# Patient Record
Sex: Male | Born: 1975 | Race: White | Hispanic: No | Marital: Single | Smoking: Current every day smoker
Health system: Southern US, Community
[De-identification: ages and names within clinical notes are randomized; demographics above are authoritative.]

## PROBLEM LIST (undated history)

## (undated) ENCOUNTER — Inpatient Hospital Stay: Admission: EM | Payer: Self-pay | Source: Home / Self Care

## (undated) DIAGNOSIS — R569 Unspecified convulsions: Secondary | ICD-10-CM

## (undated) DIAGNOSIS — R410 Disorientation, unspecified: Secondary | ICD-10-CM

## (undated) DIAGNOSIS — R531 Weakness: Secondary | ICD-10-CM

## (undated) DIAGNOSIS — J029 Acute pharyngitis, unspecified: Secondary | ICD-10-CM

## (undated) DIAGNOSIS — R634 Abnormal weight loss: Secondary | ICD-10-CM

## (undated) DIAGNOSIS — R1310 Dysphagia, unspecified: Secondary | ICD-10-CM

## (undated) DIAGNOSIS — Z87820 Personal history of traumatic brain injury: Secondary | ICD-10-CM

## (undated) DIAGNOSIS — R51 Headache: Secondary | ICD-10-CM

## (undated) DIAGNOSIS — R0981 Nasal congestion: Secondary | ICD-10-CM

## (undated) HISTORY — DX: Nasal congestion: R09.81

## (undated) HISTORY — DX: Dysphagia, unspecified: R13.10

## (undated) HISTORY — DX: Abnormal weight loss: R63.4

## (undated) HISTORY — DX: Personal history of traumatic brain injury: Z87.820

## (undated) HISTORY — DX: Disorientation, unspecified: R41.0

## (undated) HISTORY — DX: Headache: R51

## (undated) HISTORY — DX: Acute pharyngitis, unspecified: J02.9

## (undated) HISTORY — DX: Weakness: R53.1

## (undated) HISTORY — DX: Unspecified convulsions: R56.9

---

## 2002-05-19 ENCOUNTER — Inpatient Hospital Stay (HOSPITAL_COMMUNITY): Admission: AC | Admit: 2002-05-19 | Discharge: 2002-05-27 | Payer: Self-pay

## 2002-05-22 ENCOUNTER — Encounter: Payer: Self-pay | Admitting: General Surgery

## 2002-05-27 ENCOUNTER — Inpatient Hospital Stay (HOSPITAL_COMMUNITY): Admission: EM | Admit: 2002-05-27 | Discharge: 2002-05-30 | Payer: Self-pay | Admitting: Psychiatry

## 2007-09-17 ENCOUNTER — Inpatient Hospital Stay (HOSPITAL_COMMUNITY): Admission: AC | Admit: 2007-09-17 | Discharge: 2007-09-18 | Payer: Self-pay

## 2007-09-17 ENCOUNTER — Ambulatory Visit: Payer: Self-pay | Admitting: Psychiatry

## 2007-09-18 ENCOUNTER — Inpatient Hospital Stay (HOSPITAL_COMMUNITY): Admission: AD | Admit: 2007-09-18 | Discharge: 2007-09-21 | Payer: Self-pay | Admitting: *Deleted

## 2007-09-18 ENCOUNTER — Ambulatory Visit: Payer: Self-pay | Admitting: *Deleted

## 2008-03-24 ENCOUNTER — Emergency Department (HOSPITAL_COMMUNITY): Admission: EM | Admit: 2008-03-24 | Discharge: 2008-03-24 | Payer: Self-pay | Admitting: Emergency Medicine

## 2009-05-10 ENCOUNTER — Emergency Department (HOSPITAL_COMMUNITY): Admission: EM | Admit: 2009-05-10 | Discharge: 2009-05-10 | Payer: Self-pay | Admitting: Emergency Medicine

## 2010-01-07 ENCOUNTER — Emergency Department (HOSPITAL_COMMUNITY): Admission: EM | Admit: 2010-01-07 | Discharge: 2010-01-07 | Payer: Self-pay | Admitting: Emergency Medicine

## 2010-02-14 ENCOUNTER — Inpatient Hospital Stay (HOSPITAL_COMMUNITY): Admission: EM | Admit: 2010-02-14 | Discharge: 2010-02-14 | Payer: Self-pay | Admitting: Emergency Medicine

## 2010-06-13 ENCOUNTER — Encounter: Payer: Self-pay | Admitting: Emergency Medicine

## 2010-06-28 ENCOUNTER — Emergency Department (HOSPITAL_COMMUNITY)
Admission: EM | Admit: 2010-06-28 | Discharge: 2010-06-28 | Disposition: A | Discharge status: Home / Self Care | Payer: Self-pay | Attending: Emergency Medicine | Admitting: Emergency Medicine

## 2010-06-28 DIAGNOSIS — Z79899 Other long term (current) drug therapy: Secondary | ICD-10-CM | POA: Insufficient documentation

## 2010-06-28 DIAGNOSIS — R404 Transient alteration of awareness: Secondary | ICD-10-CM | POA: Insufficient documentation

## 2010-06-28 DIAGNOSIS — G40909 Epilepsy, unspecified, not intractable, without status epilepticus: Secondary | ICD-10-CM | POA: Insufficient documentation

## 2010-06-28 DIAGNOSIS — R51 Headache: Secondary | ICD-10-CM | POA: Insufficient documentation

## 2010-06-28 DIAGNOSIS — F29 Unspecified psychosis not due to a substance or known physiological condition: Secondary | ICD-10-CM | POA: Insufficient documentation

## 2010-06-28 LAB — POCT I-STAT, CHEM 8
BUN: 11 mg/dL (ref 6–23)
Calcium, Ion: 1.14 mmol/L (ref 1.12–1.32)
Creatinine, Ser: 1.2 mg/dL (ref 0.4–1.5)
Glucose, Bld: 97 mg/dL (ref 70–99)
TCO2: 31 mmol/L (ref 0–100)

## 2010-06-28 LAB — PHENYTOIN LEVEL, TOTAL: Phenytoin Lvl: <2.5 ug/mL — ABNORMAL LOW (ref 10.0–20.0)

## 2010-08-05 LAB — CBC
HCT: 39.7 % (ref 39.0–52.0)
Hemoglobin: 15.1 g/dL (ref 13.0–17.0)
MCH: 28.9 pg (ref 26.0–34.0)
MCH: 29.6 pg (ref 26.0–34.0)
MCHC: 33.2 g/dL (ref 30.0–36.0)
MCHC: 34.4 g/dL (ref 30.0–36.0)
MCV: 86.9 fL (ref 78.0–100.0)
Platelets: 234 10*3/uL (ref 150–400)
RDW: 14.5 % (ref 11.5–15.5)
RDW: 14.6 % (ref 11.5–15.5)

## 2010-08-05 LAB — BASIC METABOLIC PANEL
BUN: 9 mg/dL (ref 6–23)
BUN: 9 mg/dL (ref 6–23)
CO2: 29 mEq/L (ref 19–32)
CO2: 27 mEq/L (ref 19–32)
Calcium: 9.1 mg/dL (ref 8.4–10.5)
Chloride: 104 mEq/L (ref 96–112)
Creatinine, Ser: 0.94 mg/dL (ref 0.4–1.5)
GFR calc non Af Amer: >60 mL/min (ref >60)
Glucose, Bld: 127 mg/dL — ABNORMAL HIGH (ref 70–99)
Glucose, Bld: 90 mg/dL (ref 70–99)
Potassium: 4.4 mEq/L (ref 3.5–5.1)

## 2010-08-05 LAB — DIFFERENTIAL
Basophils Absolute: 0.1 10*3/uL (ref 0.0–0.1)
Basophils Relative: 1 % (ref 0–1)
Eosinophils Absolute: 0.4 10*3/uL (ref 0.0–0.7)
Monocytes Absolute: 0.6 10*3/uL (ref 0.1–1.0)
Monocytes Relative: 5 % (ref 3–12)
Neutro Abs: 6.6 10*3/uL (ref 1.7–7.7)
Neutrophils Relative %: 56 % (ref 43–77)

## 2010-08-12 ENCOUNTER — Emergency Department (HOSPITAL_COMMUNITY): Payer: Medicaid Other

## 2010-08-12 ENCOUNTER — Inpatient Hospital Stay (HOSPITAL_COMMUNITY)
Admission: EM | Admit: 2010-08-12 | Discharge: 2010-08-16 | DRG: 100 | Disposition: A | Discharge status: Home / Self Care | Payer: Medicaid Other | Source: Ambulatory Visit | Attending: Internal Medicine | Admitting: Internal Medicine

## 2010-08-12 ENCOUNTER — Inpatient Hospital Stay (HOSPITAL_COMMUNITY): Payer: Medicaid Other

## 2010-08-12 DIAGNOSIS — K922 Gastrointestinal hemorrhage, unspecified: Secondary | ICD-10-CM | POA: Diagnosis not present

## 2010-08-12 DIAGNOSIS — J9600 Acute respiratory failure, unspecified whether with hypoxia or hypercapnia: Secondary | ICD-10-CM

## 2010-08-12 DIAGNOSIS — R509 Fever, unspecified: Secondary | ICD-10-CM | POA: Diagnosis not present

## 2010-08-12 DIAGNOSIS — S069X9S Unspecified intracranial injury with loss of consciousness of unspecified duration, sequela: Secondary | ICD-10-CM

## 2010-08-12 DIAGNOSIS — E876 Hypokalemia: Secondary | ICD-10-CM | POA: Diagnosis not present

## 2010-08-12 DIAGNOSIS — Z9119 Patient's noncompliance with other medical treatment and regimen: Secondary | ICD-10-CM

## 2010-08-12 DIAGNOSIS — G40401 Other generalized epilepsy and epileptic syndromes, not intractable, with status epilepticus: Secondary | ICD-10-CM

## 2010-08-12 DIAGNOSIS — R404 Transient alteration of awareness: Secondary | ICD-10-CM | POA: Diagnosis not present

## 2010-08-12 DIAGNOSIS — Z91199 Patient's noncompliance with other medical treatment and regimen due to unspecified reason: Secondary | ICD-10-CM

## 2010-08-12 DIAGNOSIS — F17200 Nicotine dependence, unspecified, uncomplicated: Secondary | ICD-10-CM | POA: Diagnosis present

## 2010-08-12 DIAGNOSIS — G40802 Other epilepsy, not intractable, without status epilepticus: Principal | ICD-10-CM | POA: Diagnosis present

## 2010-08-12 DIAGNOSIS — S069XAS Unspecified intracranial injury with loss of consciousness status unknown, sequela: Secondary | ICD-10-CM

## 2010-08-12 LAB — URINALYSIS, ROUTINE W REFLEX MICROSCOPIC
Bilirubin Urine: NEGATIVE
Glucose, UA: NEGATIVE mg/dL
Nitrite: NEGATIVE
Specific Gravity, Urine: 1.019 (ref 1.005–1.030)
pH: 5 (ref 5.0–8.0)

## 2010-08-12 LAB — HEPATIC FUNCTION PANEL
ALT: 63 U/L — ABNORMAL HIGH (ref 0–53)
AST: 41 U/L — ABNORMAL HIGH (ref 0–37)
Alkaline Phosphatase: 114 U/L (ref 39–117)
Bilirubin, Direct: <0.1 mg/dL (ref 0.0–0.3)
Total Bilirubin: 0.2 mg/dL — ABNORMAL LOW (ref 0.3–1.2)

## 2010-08-12 LAB — URINE MICROSCOPIC-ADD ON

## 2010-08-12 LAB — POCT I-STAT, CHEM 8
Calcium, Ion: 1.11 mmol/L — ABNORMAL LOW (ref 1.12–1.32)
Chloride: 111 mEq/L (ref 96–112)
Glucose, Bld: 76 mg/dL (ref 70–99)
HCT: 45 % (ref 39.0–52.0)
Hemoglobin: 15.3 g/dL (ref 13.0–17.0)
Potassium: 4.9 mEq/L (ref 3.5–5.1)

## 2010-08-12 LAB — BLOOD GAS, ARTERIAL
Acid-base deficit: 9.4 mmol/L — ABNORMAL HIGH (ref 0.0–2.0)
MECHVT: 470 mL
PEEP: 5 cmH2O
RATE: 18 resp/min
pCO2 arterial: 33.5 mmHg — ABNORMAL LOW (ref 35.0–45.0)
pH, Arterial: 7.296 — ABNORMAL LOW (ref 7.350–7.450)
pO2, Arterial: 232 mmHg — ABNORMAL HIGH (ref 80.0–100.0)

## 2010-08-12 LAB — CBC
MCHC: 32.9 g/dL (ref 30.0–36.0)
Platelets: 317 10*3/uL (ref 150–400)
RDW: 15.2 % (ref 11.5–15.5)
WBC: 29.5 10*3/uL — ABNORMAL HIGH (ref 4.0–10.5)

## 2010-08-12 LAB — DIFFERENTIAL
Basophils Absolute: 0 10*3/uL (ref 0.0–0.1)
Eosinophils Absolute: 0 10*3/uL (ref 0.0–0.7)
Lymphs Abs: 1.2 10*3/uL (ref 0.7–4.0)
Monocytes Absolute: 3.2 10*3/uL — ABNORMAL HIGH (ref 0.1–1.0)
Neutrophils Relative %: 85 % — ABNORMAL HIGH (ref 43–77)

## 2010-08-12 LAB — RAPID URINE DRUG SCREEN, HOSP PERFORMED
Barbiturates: NOT DETECTED
Cocaine: NOT DETECTED
Opiates: NOT DETECTED
Tetrahydrocannabinol: NOT DETECTED

## 2010-08-12 LAB — PHENYTOIN LEVEL, TOTAL: Phenytoin Lvl: 23.1 ug/mL — ABNORMAL HIGH (ref 10.0–20.0)

## 2010-08-12 LAB — MRSA PCR SCREENING: MRSA by PCR: NEGATIVE

## 2010-08-13 ENCOUNTER — Inpatient Hospital Stay (HOSPITAL_COMMUNITY): Payer: Medicaid Other

## 2010-08-13 DIAGNOSIS — F05 Delirium due to known physiological condition: Secondary | ICD-10-CM

## 2010-08-13 LAB — PHOSPHORUS
Phosphorus: 4 mg/dL (ref 2.3–4.6)
Phosphorus: 3 mg/dL (ref 2.3–4.6)

## 2010-08-13 LAB — CBC
HCT: 38.8 % — ABNORMAL LOW (ref 39.0–52.0)
HCT: 38.4 % — ABNORMAL LOW (ref 39.0–52.0)
Hemoglobin: 12.8 g/dL — ABNORMAL LOW (ref 13.0–17.0)
MCH: 28.4 pg (ref 26.0–34.0)
MCHC: 32.5 g/dL (ref 30.0–36.0)
MCHC: 33.3 g/dL (ref 30.0–36.0)
Platelets: 256 10*3/uL (ref 150–400)
RDW: 15.3 % (ref 11.5–15.5)

## 2010-08-13 LAB — BLOOD GAS, ARTERIAL
Acid-base deficit: 3.2 mmol/L — ABNORMAL HIGH (ref 0.0–2.0)
Bicarbonate: 18.4 mEq/L — ABNORMAL LOW (ref 20.0–24.0)
Bicarbonate: 21.6 mEq/L (ref 20.0–24.0)
FIO2: 0.4 %
O2 Saturation: 99.6 %
O2 Saturation: 99.4 %
PEEP: 5 cmH2O
TCO2: 19.2 mmol/L (ref 0–100)
pO2, Arterial: 184 mmHg — ABNORMAL HIGH (ref 80.0–100.0)
pO2, Arterial: 193 mmHg — ABNORMAL HIGH (ref 80.0–100.0)

## 2010-08-13 LAB — BASIC METABOLIC PANEL
GFR calc non Af Amer: >60 mL/min (ref >60)
GFR calc non Af Amer: >60 mL/min (ref >60)
Glucose, Bld: 101 mg/dL — ABNORMAL HIGH (ref 70–99)
Glucose, Bld: 106 mg/dL — ABNORMAL HIGH (ref 70–99)
Potassium: 4.2 mEq/L (ref 3.5–5.1)
Potassium: 4.1 mEq/L (ref 3.5–5.1)
Sodium: 136 mEq/L (ref 135–145)
Sodium: 135 mEq/L (ref 135–145)

## 2010-08-13 LAB — PHENYTOIN LEVEL, TOTAL: Phenytoin Lvl: 21.2 ug/mL — ABNORMAL HIGH (ref 10.0–20.0)

## 2010-08-13 LAB — DIFFERENTIAL
Basophils Absolute: 0 10*3/uL (ref 0.0–0.1)
Lymphocytes Relative: 20 % (ref 12–46)
Monocytes Absolute: 3.1 10*3/uL — ABNORMAL HIGH (ref 0.1–1.0)
Monocytes Relative: 15 % — ABNORMAL HIGH (ref 3–12)
Neutro Abs: 13.5 10*3/uL — ABNORMAL HIGH (ref 1.7–7.7)

## 2010-08-13 LAB — URINE CULTURE: Colony Count: NO GROWTH

## 2010-08-13 LAB — PROTIME-INR: INR: 1.09 (ref 0.00–1.49)

## 2010-08-13 LAB — PROCALCITONIN: Procalcitonin: 11.71 ng/mL
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
## 2010-08-15 ENCOUNTER — Inpatient Hospital Stay (HOSPITAL_COMMUNITY): Payer: Medicaid Other

## 2010-08-15 LAB — CBC
MCHC: 33.2 g/dL (ref 30.0–36.0)
Platelets: 253 10*3/uL (ref 150–400)
RDW: 15.1 % (ref 11.5–15.5)
WBC: 9.6 10*3/uL (ref 4.0–10.5)

## 2010-08-15 LAB — CULTURE, RESPIRATORY W GRAM STAIN

## 2010-08-15 LAB — BASIC METABOLIC PANEL
CO2: 25 mEq/L (ref 19–32)
Calcium: 8.9 mg/dL (ref 8.4–10.5)
GFR calc Af Amer: >60 mL/min (ref >60)
GFR calc non Af Amer: >60 mL/min (ref >60)
Potassium: 2.9 mEq/L — ABNORMAL LOW (ref 3.5–5.1)
Sodium: 142 mEq/L (ref 135–145)

## 2010-08-16 LAB — BASIC METABOLIC PANEL
Calcium: 8.8 mg/dL (ref 8.4–10.5)
GFR calc Af Amer: >60 mL/min (ref >60)
GFR calc non Af Amer: >60 mL/min (ref >60)
Potassium: 3.1 mEq/L — ABNORMAL LOW (ref 3.5–5.1)
Sodium: 139 mEq/L (ref 135–145)
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
## 2010-08-19 LAB — CULTURE, BLOOD (ROUTINE X 2)
Culture  Setup Time: 201203230117
Culture  Setup Time: 201203230117
Culture: NO GROWTH

## 2010-08-23 LAB — BASIC METABOLIC PANEL
GFR calc non Af Amer: >60 mL/min (ref >60)
Potassium: 4.6 mEq/L (ref 3.5–5.1)
Sodium: 132 mEq/L — ABNORMAL LOW (ref 135–145)

## 2010-08-23 LAB — BASIC METABOLIC PANEL WITH GFR
BUN: 7 mg/dL (ref 6–23)
CO2: 29 meq/L (ref 19–32)
Calcium: 9.2 mg/dL (ref 8.4–10.5)
Chloride: 97 meq/L (ref 96–112)
Creatinine, Ser: 0.88 mg/dL (ref 0.4–1.5)
GFR calc Af Amer: >60 mL/min (ref >60)
Glucose, Bld: 98 mg/dL (ref 70–99)

## 2010-08-23 LAB — RAPID URINE DRUG SCREEN, HOSP PERFORMED
Amphetamines: NOT DETECTED
Barbiturates: NOT DETECTED
Benzodiazepines: NOT DETECTED
Cocaine: NOT DETECTED
Opiates: POSITIVE — AB
Tetrahydrocannabinol: NOT DETECTED

## 2010-08-23 LAB — DIFFERENTIAL
Basophils Absolute: 0.1 K/uL (ref 0.0–0.1)
Basophils Relative: 1 % (ref 0–1)
Eosinophils Absolute: 0.5 K/uL (ref 0.0–0.7)
Eosinophils Relative: 4 % (ref 0–5)
Lymphocytes Relative: 23 % (ref 12–46)
Lymphs Abs: 2.8 10*3/uL (ref 0.7–4.0)
Monocytes Absolute: 1.4 10*3/uL — ABNORMAL HIGH (ref 0.1–1.0)
Monocytes Relative: 11 % (ref 3–12)
Neutro Abs: 7.4 K/uL (ref 1.7–7.7)
Neutrophils Relative %: 61 % (ref 43–77)

## 2010-08-23 LAB — URINALYSIS, ROUTINE W REFLEX MICROSCOPIC
Bilirubin Urine: NEGATIVE
Glucose, UA: NEGATIVE mg/dL
Hgb urine dipstick: NEGATIVE
Ketones, ur: NEGATIVE mg/dL
Nitrite: NEGATIVE
Protein, ur: NEGATIVE mg/dL
Specific Gravity, Urine: 1.016 (ref 1.005–1.030)
Urobilinogen, UA: 0.2 mg/dL (ref 0.0–1.0)
pH: 5.5 (ref 5.0–8.0)

## 2010-08-23 LAB — CBC
HCT: 43.8 % (ref 39.0–52.0)
Hemoglobin: 14.7 g/dL (ref 13.0–17.0)
MCHC: 33.6 g/dL (ref 30.0–36.0)
MCV: 87.4 fL (ref 78.0–100.0)
Platelets: 288 K/uL (ref 150–400)
RBC: 5.01 MIL/uL (ref 4.22–5.81)
RDW: 14.1 % (ref 11.5–15.5)
WBC: 12.1 10*3/uL — ABNORMAL HIGH (ref 4.0–10.5)
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
## 2011-02-13 ENCOUNTER — Emergency Department (HOSPITAL_COMMUNITY)
Admission: EM | Admit: 2011-02-13 | Discharge: 2011-02-14 | Disposition: A | Discharge status: Home / Self Care | Payer: Medicaid Other | Attending: Emergency Medicine | Admitting: Emergency Medicine

## 2011-02-13 DIAGNOSIS — G40909 Epilepsy, unspecified, not intractable, without status epilepticus: Secondary | ICD-10-CM | POA: Insufficient documentation

## 2011-02-13 DIAGNOSIS — Z79899 Other long term (current) drug therapy: Secondary | ICD-10-CM | POA: Insufficient documentation

## 2011-02-13 DIAGNOSIS — F29 Unspecified psychosis not due to a substance or known physiological condition: Secondary | ICD-10-CM | POA: Insufficient documentation

## 2011-02-14 LAB — CBC
MCH: 28.7 pg (ref 26.0–34.0)
MCV: 83.2 fL (ref 78.0–100.0)
Platelets: 261 10*3/uL (ref 150–400)
RDW: 14 % (ref 11.5–15.5)
WBC: 12 10*3/uL — ABNORMAL HIGH (ref 4.0–10.5)

## 2011-02-14 LAB — URINALYSIS, ROUTINE W REFLEX MICROSCOPIC
Ketones, ur: NEGATIVE mg/dL
Leukocytes, UA: NEGATIVE
Nitrite: NEGATIVE
Protein, ur: NEGATIVE mg/dL

## 2011-02-14 LAB — DIFFERENTIAL
Basophils Relative: 0 % (ref 0–1)
Eosinophils Absolute: 0.2 10*3/uL (ref 0.0–0.7)
Monocytes Absolute: 1 10*3/uL (ref 0.1–1.0)
Neutro Abs: 3.7 10*3/uL (ref 1.7–7.7)
Neutrophils Relative %: 31 % — ABNORMAL LOW (ref 43–77)

## 2011-02-14 LAB — PHENYTOIN LEVEL, TOTAL: Phenytoin Lvl: 24.3 ug/mL — ABNORMAL HIGH (ref 10.0–20.0)

## 2011-02-14 LAB — PATHOLOGIST SMEAR REVIEW

## 2011-02-14 LAB — POCT I-STAT, CHEM 8
Calcium, Ion: 1.1 mmol/L — ABNORMAL LOW (ref 1.12–1.32)
HCT: 49 % (ref 39.0–52.0)
Hemoglobin: 16.7 g/dL (ref 13.0–17.0)
TCO2: 26 mmol/L (ref 0–100)

## 2011-02-15 LAB — DIFFERENTIAL
Basophils Absolute: 0.2 — ABNORMAL HIGH
Basophils Relative: 2 — ABNORMAL HIGH
Eosinophils Absolute: 0.3
Monocytes Absolute: 1.1 — ABNORMAL HIGH
Neutro Abs: 4.8
Neutrophils Relative %: 42 — ABNORMAL LOW

## 2011-02-15 LAB — CBC
MCHC: 34.5
Platelets: 232
RBC: 4.91
WBC: 11.4 — ABNORMAL HIGH

## 2011-02-15 LAB — POCT I-STAT, CHEM 8
Calcium, Ion: 1.14
Chloride: 109
HCT: 44
Hemoglobin: 15

## 2011-02-15 LAB — TRICYCLICS SCREEN, URINE: TCA Scrn: NOT DETECTED

## 2011-02-15 LAB — CREATININE, SERUM
Creatinine, Ser: 1.04
GFR calc Af Amer: >60

## 2011-02-24 ENCOUNTER — Encounter (INDEPENDENT_AMBULATORY_CARE_PROVIDER_SITE_OTHER): Payer: Self-pay | Admitting: General Surgery

## 2011-02-25 ENCOUNTER — Encounter (INDEPENDENT_AMBULATORY_CARE_PROVIDER_SITE_OTHER): Payer: Self-pay | Admitting: General Surgery

## 2011-02-25 ENCOUNTER — Ambulatory Visit (INDEPENDENT_AMBULATORY_CARE_PROVIDER_SITE_OTHER): Payer: Medicaid Other | Admitting: General Surgery

## 2011-02-25 DIAGNOSIS — L723 Sebaceous cyst: Secondary | ICD-10-CM
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
## 2011-03-14 ENCOUNTER — Other Ambulatory Visit: Payer: Self-pay | Admitting: Otolaryngology

## 2011-03-14 ENCOUNTER — Ambulatory Visit (HOSPITAL_BASED_OUTPATIENT_CLINIC_OR_DEPARTMENT_OTHER)
Admission: RE | Admit: 2011-03-14 | Discharge: 2011-03-14 | Disposition: A | Discharge status: Home / Self Care | Payer: Medicaid Other | Source: Ambulatory Visit | Attending: Otolaryngology | Admitting: Otolaryngology

## 2011-03-14 DIAGNOSIS — L723 Sebaceous cyst: Secondary | ICD-10-CM | POA: Insufficient documentation

## 2011-03-14 DIAGNOSIS — F17200 Nicotine dependence, unspecified, uncomplicated: Secondary | ICD-10-CM | POA: Insufficient documentation

## 2011-03-14 DIAGNOSIS — J4489 Other specified chronic obstructive pulmonary disease: Secondary | ICD-10-CM | POA: Insufficient documentation

## 2011-03-14 DIAGNOSIS — Z01812 Encounter for preprocedural laboratory examination: Secondary | ICD-10-CM | POA: Insufficient documentation

## 2011-03-14 DIAGNOSIS — G40909 Epilepsy, unspecified, not intractable, without status epilepticus: Secondary | ICD-10-CM | POA: Insufficient documentation

## 2011-03-14 DIAGNOSIS — J449 Chronic obstructive pulmonary disease, unspecified: Secondary | ICD-10-CM | POA: Insufficient documentation

## 2011-03-14 LAB — POCT HEMOGLOBIN-HEMACUE: Hemoglobin: 11 g/dL — ABNORMAL LOW (ref 13.0–17.0)

## 2011-03-17 NOTE — Op Note (Signed)
  NAMERYKIN, ROUTE           ACCOUNT NO.:  [id number]  MEDICAL RECORD NO.:  [id number]  LOCATION:                                 FACILITY:  PHYSICIAN:  Lynell Kussman H. Pollyann Kennedy, MD     DATE OF BIRTH:  [birth year]  DATE OF PROCEDURE:  03/14/2011 DATE OF DISCHARGE:                              OPERATIVE REPORT   PREOPERATIVE DIAGNOSIS:  Right neck cyst.  POSTOPERATIVE DIAGNOSIS:  Right neck epidermoid cyst.  PROCEDURE:  Excision of right neck epidermoid cyst.  SURGEON:  Aryana Wonnacott H. Pollyann Kennedy, MD  ANESTHESIA:  Local anesthesia was used with intravenous sedation and monitored anesthesia care.  COMPLICATIONS:  No complications.  ESTIMATED BLOOD LOSS:  Minimal.  FINDINGS:  A 4-5 cm cystic subcutaneous mass in the right anterior neck containing a sebaceous-like material with some black matter also within.  HISTORY:  A 35 year old with a slowly enlarging right neck mass.  Risks, benefits, alternatives, and complications of the procedure wereexplained to the patient, seemed to understand, and agreed to surgery.  PROCEDURE:  The patient was taken to the operating room, placed in the operating table in supine position.  Following induction of intravenous sedation, the right neck was prepped and draped in a standard fashion. An ellipse of skin overlying the center of the lesion was outlined with a marking pen and local anesthesia was infiltrated around the entire lesion.  About 8 mL was used of 1% Xylocaine with epinephrine.  The ellipse of skin was incised with a 15-scalpel and care was taken to dissect down into the subcutaneous plane.  With gentle retraction of the skin edges, I was able to dissect around the lesion.  Right before the lesion was completely dissected out, it did rupture externally not within the wound.  The entire cyst was removed using careful dissection. Facial nerve branches were not identified as the level of dissection was felt to be superficial to that.   Electrocautery was used for completion of hemostasis.  The wound was irrigated with saline.  Closure was accomplished using 4-0 chromic suture in the subcutaneous and deeper plane and Dermabond on the skin.  The patient was then transferred to recovery in stable condition.     Liam Cammarata H. Pollyann Kennedy, MD     JHR/MEDQ  D:  03/14/2011  T:  03/14/2011  Job:  [id number]  Electronically Signed by Serena Colonel MD on 03/17/2011 07:08:03 PM

## 2012-06-01 ENCOUNTER — Other Ambulatory Visit (HOSPITAL_COMMUNITY): Payer: Self-pay | Admitting: Neurology

## 2012-06-01 DIAGNOSIS — R569 Unspecified convulsions: Secondary | ICD-10-CM

## 2012-06-07 ENCOUNTER — Ambulatory Visit (HOSPITAL_COMMUNITY): Payer: Medicaid Other

## 2012-06-11 ENCOUNTER — Ambulatory Visit (HOSPITAL_COMMUNITY)
Admission: RE | Admit: 2012-06-11 | Discharge: 2012-06-11 | Disposition: A | Discharge status: Home / Self Care | Payer: Medicaid Other | Source: Ambulatory Visit | Attending: Neurology | Admitting: Neurology

## 2012-06-11 DIAGNOSIS — R569 Unspecified convulsions: Secondary | ICD-10-CM

## 2012-06-12 ENCOUNTER — Other Ambulatory Visit (HOSPITAL_COMMUNITY): Payer: Self-pay | Admitting: Neurology

## 2012-06-12 DIAGNOSIS — R569 Unspecified convulsions: Secondary | ICD-10-CM

## 2012-06-21 ENCOUNTER — Ambulatory Visit (HOSPITAL_COMMUNITY): Payer: Medicaid Other

## 2012-09-27 ENCOUNTER — Encounter: Payer: Self-pay | Admitting: Nurse Practitioner

## 2012-09-27 ENCOUNTER — Ambulatory Visit (INDEPENDENT_AMBULATORY_CARE_PROVIDER_SITE_OTHER): Payer: Self-pay | Admitting: Nurse Practitioner

## 2012-09-27 VITALS — BP 124/69 | HR 85 | Ht 66.0 in | Wt 139.0 lb

## 2012-09-27 DIAGNOSIS — R569 Unspecified convulsions: Secondary | ICD-10-CM | POA: Insufficient documentation

## 2012-09-27 DIAGNOSIS — Z87820 Personal history of traumatic brain injury: Secondary | ICD-10-CM | POA: Insufficient documentation
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
## 2012-09-27 NOTE — Patient Instructions (Addendum)
Patient to continue current Dilantin dose for now Labs done at Dr. Jearl Klinefelter office yesterday. Call to office and line busy x 20 min If Dr. Quintella Reichert is ordering his Dilantin no need to f/u.  We need the labs before Rx renewed.

## 2012-09-28 NOTE — Addendum Note (Signed)
Addended by: Melvyn Novas on: 09/28/2012 01:20 PM   Modules accepted: Orders

## 2012-10-03 ENCOUNTER — Telehealth: Payer: Self-pay | Admitting: Nurse Practitioner

## 2012-10-04 NOTE — Telephone Encounter (Signed)
Called patient left message verifying where he had labs. Per Dr.Hooper's office he has not been seen in one year.

## 2012-10-04 NOTE — Telephone Encounter (Signed)
Last OV says: Patient to continue current Dilantin dose for now  Labs done at Dr. Jearl Klinefelter office yesterday. Call to office and line busy x 20 min  If Dr. Quintella Reichert is ordering his Dilantin no need to f/u.  We need the labs before Rx renewed.  I have forwarded message to Triage to contact patient regarding labs.    TRIAGE: Please let me know what needs to be done regarding Rx once lab results are in.  Thank you.

## 2012-10-05 ENCOUNTER — Telehealth: Payer: Self-pay | Admitting: Nurse Practitioner

## 2012-10-05 NOTE — Telephone Encounter (Signed)
Called and left message for patient X4. Asking patient to please call me and tell where he had labs. We need his labs before we can give him any Dilantin. I called [facility] on [address]  [phone]. He is a patient there but he has not been seen in one year. I also told patient  can come here and have labs drawn. Monday -Friday 8-4 Labs goes to lunch from 12-1.

## 2012-10-09 ENCOUNTER — Telehealth: Payer: Self-pay | Admitting: Nurse Practitioner

## 2012-10-09 NOTE — Telephone Encounter (Signed)
[name] said he spoke with his doctor and they informed him that they faxed his lab results over Tuesday to [name] and he was checking on the results. Please call.. Thanks

## 2012-10-10 ENCOUNTER — Encounter: Payer: Self-pay | Admitting: Nurse Practitioner

## 2012-10-10 MED ORDER — PHENYTOIN SODIUM EXTENDED 100 MG PO CAPS: ORAL_CAPSULE | ORAL | Status: DC

## 2012-10-10 NOTE — Telephone Encounter (Signed)
This encounter was created in error - please disregard.

## 2012-10-10 NOTE — Telephone Encounter (Signed)
I received his labs today, 10/10/12. His Dilantin level looks good at 15.4.  Continue same dose of Dilantin. Your Alkaline Phosphatase was high and will be followed by Dr. Mayford Knife. Meds reordered today.

## 2012-10-10 NOTE — Telephone Encounter (Signed)
Message has been forwarded.

## 2012-10-11 ENCOUNTER — Telehealth: Payer: Self-pay | Admitting: Nurse Practitioner

## 2012-10-11 NOTE — Telephone Encounter (Signed)
Patient is aware and understands.

## 2012-10-12 NOTE — Telephone Encounter (Signed)
Patient already called with results

## 2012-11-16 ENCOUNTER — Telehealth: Payer: Self-pay | Admitting: Nurse Practitioner

## 2012-11-16 DIAGNOSIS — Z79899 Other long term (current) drug therapy: Secondary | ICD-10-CM

## 2012-11-16 NOTE — Telephone Encounter (Signed)
Yesterday morning had a seizure that lasted a couple minutes. The night before patient had a seizure that lasted 30 minutes. It took 45 minutes to an hour to recover and gain control of his motor function.   The last seizure before that was about a month ago.   Patient states that everything is normal. He is getting enough sleep, not sick. Patient had been on klonipin until about a year ago. They switched him from Klonipin to Buspar two months later. He has not taken his buspar now for 3-4 months. He was being treated for anxiety but is not having much trouble with anxiety because he is not going anywhere. He reports he has an explosive temper when anxious but again, he is staying home and staying relatively calm.   Please advise regarding his seizures.

## 2012-11-19 ENCOUNTER — Other Ambulatory Visit: Payer: Self-pay | Admitting: Neurology

## 2012-11-19 DIAGNOSIS — R569 Unspecified convulsions: Secondary | ICD-10-CM

## 2012-11-19 DIAGNOSIS — S06890S Other specified intracranial injury without loss of consciousness, sequela: Secondary | ICD-10-CM

## 2012-11-19 NOTE — Telephone Encounter (Signed)
Called patient. Explained NP-Carolyn previous note. Patient says he is having a really hard time getting transportation. The only sure way of transportation that he has is provided when he has to go to the clinic on next Wednesday to have labs. He says he will not take his med before he goes there, they will check his Dilantin blood level, and forward it to our office.

## 2012-11-19 NOTE — Telephone Encounter (Signed)
Please call patient with requests for obtaining  Phenytoin level.

## 2012-11-19 NOTE — Telephone Encounter (Signed)
Patient takes Dilantin twice daily, please have him come to the office tomorrow am before taking Dilantin and get blood level. Order in system. Please call patient.

## 2012-11-20 DIAGNOSIS — S069X9A Unspecified intracranial injury with loss of consciousness of unspecified duration, initial encounter: Secondary | ICD-10-CM | POA: Insufficient documentation

## 2012-11-20 DIAGNOSIS — S069XAA Unspecified intracranial injury with loss of consciousness status unknown, initial encounter: Secondary | ICD-10-CM | POA: Insufficient documentation

## 2012-11-20 DIAGNOSIS — R569 Unspecified convulsions: Secondary | ICD-10-CM | POA: Insufficient documentation

## 2012-12-03 NOTE — Telephone Encounter (Signed)
Labs reviewed from Dr. Mayford Knife office. No DILANTIN level was drawn. Meds cannot be adjusted .

## 2012-12-10 NOTE — Telephone Encounter (Signed)
Tried to reach patient several times. Phone goes straight to vmail. Left 2 messages.

## 2012-12-13 ENCOUNTER — Telehealth: Payer: Self-pay | Admitting: Nurse Practitioner

## 2012-12-13 NOTE — Telephone Encounter (Signed)
Spoke to patient. Advised we have been trying to reach him. He says he told Dr. Mayford Knife office to draw Dilantin levels. Advised him we did receive some labs, but no Dilantin level. He says he will contact their office and make sure they did it, and have them forwarded to [place]. If not, when he goes there on on Wed. 12/19/12 he will have it done.

## 2013-02-20 ENCOUNTER — Telehealth: Payer: Self-pay | Admitting: Nurse Practitioner

## 2013-02-20 NOTE — Telephone Encounter (Signed)
7 months of medication refills were sent to the pharmacy on 05/21.  Pharmacy has refills on file and will fill Rx.  They will contact the patient when meds are ready for pick up.

## 2013-02-21 ENCOUNTER — Telehealth: Payer: Self-pay | Admitting: Nurse Practitioner

## 2013-02-21 NOTE — Telephone Encounter (Signed)
I called the pharmacy.  They said there were no issues with the patients meds, and in fact he has already picked them up.  I called the patient he said he has his meds.

## 2013-04-05 ENCOUNTER — Telehealth: Payer: Self-pay | Admitting: Neurology

## 2013-04-05 NOTE — Telephone Encounter (Signed)
LM FOR PT TO CALL AND SCHEDULE R/EEG PER DR. DOHMEIER

## 2013-04-23 MED ORDER — PHENYTOIN SODIUM EXTENDED 100 MG PO CAPS: ORAL_CAPSULE | ORAL | Status: DC

## 2013-07-23 ENCOUNTER — Telehealth: Payer: Self-pay | Admitting: *Deleted

## 2013-07-23 NOTE — Telephone Encounter (Signed)
A six month supply was already sent to the pharmacy in Dec.  I called the pharmacy and they said the patient has the Rx on file.  I called the patient back.  Got no answer.  Left message.
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
## 2013-09-28 ENCOUNTER — Other Ambulatory Visit: Payer: Self-pay | Admitting: Neurology

## 2014-01-14 ENCOUNTER — Ambulatory Visit: Payer: Medicaid Other | Admitting: Nurse Practitioner

## 2014-01-14 ENCOUNTER — Telehealth: Payer: Self-pay | Admitting: Nurse Practitioner

## 2014-01-14 NOTE — Telephone Encounter (Signed)
No show for scheduled appointment 

## 2014-01-17 ENCOUNTER — Telehealth: Payer: Self-pay | Admitting: *Deleted

## 2014-01-17 NOTE — Telephone Encounter (Signed)
Called patient because he missed his appointment on 01/06/14 with NP CM, and per CM he could be r/s with NP MM, patient states that he believed that his medicaid ran out but wasn't sure, had Dorathy Daft verify and it terminated 12/20/13, patient states that he just started working and his insurance has not started yet, however he needs a refill on his Dilantin 100 mg, patient last refill was  07/23/2013 and last seen in the office 09/27/2012. Informed patient that he does need to come in since it has been over a year since his last visit, patient then informs me that he does have insurance, offered to schedule an appointment so he can get his medication, patient stated that he would have to call back because he didn't know if he would be able to get off from work. Patient should be reassigned to NP MM.

## 2014-10-13 ENCOUNTER — Emergency Department (HOSPITAL_COMMUNITY)
Admission: EM | Admit: 2014-10-13 | Discharge: 2014-10-14 | Disposition: A | Discharge status: Home / Self Care | Payer: Medicaid Other | Attending: Emergency Medicine | Admitting: Emergency Medicine

## 2014-10-13 DIAGNOSIS — Z720 Tobacco use: Secondary | ICD-10-CM | POA: Insufficient documentation

## 2014-10-13 DIAGNOSIS — Z8709 Personal history of other diseases of the respiratory system: Secondary | ICD-10-CM | POA: Insufficient documentation

## 2014-10-13 DIAGNOSIS — Z87820 Personal history of traumatic brain injury: Secondary | ICD-10-CM | POA: Insufficient documentation

## 2014-10-13 DIAGNOSIS — Z041 Encounter for examination and observation following transport accident: Secondary | ICD-10-CM | POA: Insufficient documentation

## 2014-10-13 DIAGNOSIS — G40909 Epilepsy, unspecified, not intractable, without status epilepticus: Secondary | ICD-10-CM | POA: Insufficient documentation

## 2014-10-13 DIAGNOSIS — Y998 Other external cause status: Secondary | ICD-10-CM | POA: Insufficient documentation

## 2014-10-13 DIAGNOSIS — Y9389 Activity, other specified: Secondary | ICD-10-CM | POA: Insufficient documentation

## 2014-10-13 DIAGNOSIS — Z79899 Other long term (current) drug therapy: Secondary | ICD-10-CM | POA: Insufficient documentation

## 2014-10-13 DIAGNOSIS — Y92410 Unspecified street and highway as the place of occurrence of the external cause: Secondary | ICD-10-CM | POA: Insufficient documentation

## 2014-10-13 NOTE — Discharge Instructions (Signed)

## 2014-10-13 NOTE — ED Notes (Signed)
Unable to collect patient blood he kept on moving and pull the needle out.  PA aware.
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
## 2014-10-13 NOTE — ED Notes (Signed)
Pt refused to have blood drawn and states, "ain't nobody taking my fuckin blood!" [place] Butler[place], [place]PA and charge RN aware.

## 2017-12-08 ENCOUNTER — Encounter (HOSPITAL_COMMUNITY): Payer: Self-pay

## 2017-12-08 ENCOUNTER — Ambulatory Visit (HOSPITAL_COMMUNITY)
Admission: EM | Admit: 2017-12-08 | Discharge: 2017-12-08 | Disposition: A | Discharge status: Home / Self Care | Payer: BLUE CROSS/BLUE SHIELD | Attending: Internal Medicine | Admitting: Internal Medicine

## 2017-12-08 DIAGNOSIS — J320 Chronic maxillary sinusitis: Secondary | ICD-10-CM

## 2017-12-08 MED ORDER — AMOXICILLIN 500 MG PO CAPS: 500.0000 mg | ORAL_CAPSULE | Freq: Three times a day (TID) | ORAL | 0 refills | Status: AC

## 2017-12-08 NOTE — Discharge Instructions (Signed)
Return if any problems.

## 2017-12-08 NOTE — ED Provider Notes (Signed)
[facility]    CSN: [id number] Arrival date & time: 12/08/17  1722     History   Chief Complaint Chief Complaint  Patient presents with  . Nasal Congestion    HPI Dale BarriosMichael Jessie is a 42 y.o. male.   The history is provided by the patient. No language interpreter was used.  Cough  Cough characteristics:  Productive Sputum characteristics:  Nondescript Severity:  Moderate Onset quality:  Gradual Timing:  Constant Progression:  Worsening Smoker: no   Relieved by:  Nothing Worsened by:  Nothing Ineffective treatments:  None tried Associated symptoms: ear fullness and ear pain   Pt complains of sinus congestion.   Past Medical History:  Diagnosis Date  . Confusion   . Headache(784.0)   . Nasal congestion   . Personal history of traumatic brain injury   . Seizures (HCC)   . Sore throat   . Trouble swallowing   . Weakness   . Weight loss     Patient Active Problem List   Diagnosis Date Noted  . 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 Convulsions/seizures (HCC) 11/20/2012  . Other convulsions 09/27/2012  . Personal history of traumatic brain injury 09/27/2012    History reviewed. No pertinent surgical history.     Home Medications    Prior to Admission medications   Medication Sig Start Date End Date Taking? Authorizing Provider  amoxicillin (AMOXIL) 500 MG capsule Take 1 capsule (500 mg total) by mouth 3 (three) times daily. 12/08/17   Elson AreasSofia, Currie Dennin K, PA-C  Multiple Vitamins-Minerals (MULTIVITAL PO) Take by mouth.      [provider]  phenytoin (DILANTIN) 100 MG ER capsule TAKE TWO CAPSULES BY MOUTH ONCE DAILY IN THE MORNING AND ONE ONCE DAILY IN THE EVENING    Dohmeier, Porfirio Mylararmen, MD    Family History No family history on file.  Social History Social History   Tobacco Use  . Smoking status: Current Every Day Smoker    Packs/day: 1.00    Types: Cigarettes  . Smokeless tobacco: Never Used  Substance Use Topics  .  Alcohol use: No    Comment: drinks 2 glasses of soft drinks daily  . Drug use: No     Allergies   Patient has no known allergies.   Review of Systems Review of Systems  HENT: Positive for ear pain.   Respiratory: Positive for cough.   All other systems reviewed and are negative.    Physical Exam Triage Vital Signs ED Triage Vitals  Enc Vitals Group     BP 12/08/17 1739 128/63     Pulse Rate 12/08/17 1739 76     Resp 12/08/17 1739 18     Temp 12/08/17 1739 97.8 F (36.6 C)     Temp src --      SpO2 12/08/17 1739 100 %     Weight --      Height --      Head Circumference --      Peak Flow --      Pain Score 12/08/17 1740 0     Pain Loc --      Pain Edu? --      Excl. in GC? --    No data found.  Updated Vital Signs BP 128/63   Pulse 76   Temp 97.8 F (36.6 C)   Resp 18   SpO2 100%   Visual Acuity Right Eye Distance:   Left Eye Distance:   Bilateral Distance:  Right Eye Near:   Left Eye Near:    Bilateral Near:     Physical Exam  Constitutional: He appears well-developed and well-nourished.  HENT:  Head: Normocephalic and atraumatic.  Right Ear: External ear normal.  Left Ear: External ear normal.  Tender maxillary sinuses   Eyes: Conjunctivae are normal.  Neck: Neck supple.  Cardiovascular: Normal rate and regular rhythm.  No murmur heard. Pulmonary/Chest: Effort normal and breath sounds normal. No respiratory distress.  Abdominal: There is no tenderness.  Musculoskeletal: He exhibits no edema.  Neurological: He is alert.  Skin: Skin is warm and dry.  Psychiatric: He has a normal mood and affect.  Nursing note and vitals reviewed.    UC Treatments / Results  Labs (all labs ordered are listed, but only abnormal results are displayed) Labs Reviewed - No data to display  EKG None  Radiology No results found.  Procedures Procedures (including critical care time)  Medications Ordered in UC Medications - No data to  display  Initial Impression / Assessment and Plan / UC Course  I have reviewed the triage vital signs and the nursing notes.  Pertinent labs & imaging results that were available during my care of the patient were reviewed by me and considered in my medical decision making (see chart for details).      Final Clinical Impressions(s) / UC Diagnoses   Final diagnoses:  Chronic maxillary sinusitis     Discharge Instructions     Return if any problems.    ED Prescriptions    Medication Sig Dispense Auth. Provider   amoxicillin (AMOXIL) 500 MG capsule Take 1 capsule (500 mg total) by mouth 3 (three) times daily. 30 capsule Elson Areas, [place]     Controlled Substance Prescriptions [place] Controlled Substance Registry consulted? Not Applicable   Elson Areas, [place] 12/08/17 2142

## 2017-12-08 NOTE — ED Triage Notes (Signed)
Pt reports with complaints of getting water in his ears July 4th, then having a head cold now feeling like he has a chest cold. Over the last week reports fatigue and productive cough.
